# Patient Record
Sex: Male | Born: 2000 | Race: Black or African American | Hispanic: No | Marital: Single | Smoking: Never smoker
Health system: Southern US, Community
[De-identification: ages and names within clinical notes are randomized; demographics above are authoritative.]

## PROBLEM LIST (undated history)

## (undated) DIAGNOSIS — R519 Headache, unspecified: Secondary | ICD-10-CM

## (undated) DIAGNOSIS — J45909 Unspecified asthma, uncomplicated: Secondary | ICD-10-CM

## (undated) DIAGNOSIS — R51 Headache: Secondary | ICD-10-CM

## (undated) HISTORY — PX: CIRCUMCISION: SUR203

---
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
## 2022-11-12 ENCOUNTER — Ambulatory Visit: Payer: Self-pay | Admitting: Nurse Practitioner

## 2022-12-30 ENCOUNTER — Telehealth: Payer: Self-pay

## 2022-12-30 NOTE — Telephone Encounter (Signed)
LVM for patient to call back to [facility] AS, CMA
